# Patient Record
Sex: Female | Born: 1985 | Race: Black or African American | Hispanic: No | Marital: Single
Health system: Southern US, Community
[De-identification: ages and names within clinical notes are randomized; demographics above are authoritative.]

---
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
## 2005-02-03 ENCOUNTER — Emergency Department (HOSPITAL_COMMUNITY): Admission: EM | Admit: 2005-02-03 | Discharge: 2005-02-03 | Payer: Self-pay | Admitting: Emergency Medicine

## 2005-03-18 ENCOUNTER — Emergency Department (HOSPITAL_COMMUNITY): Admission: EM | Admit: 2005-03-18 | Discharge: 2005-03-18 | Payer: Self-pay | Admitting: Emergency Medicine

## 2005-06-06 ENCOUNTER — Inpatient Hospital Stay (HOSPITAL_COMMUNITY): Admission: AD | Admit: 2005-06-06 | Discharge: 2005-06-06 | Payer: Self-pay | Admitting: Obstetrics

## 2005-06-25 ENCOUNTER — Inpatient Hospital Stay (HOSPITAL_COMMUNITY): Admission: AD | Admit: 2005-06-25 | Discharge: 2005-06-26 | Payer: Self-pay | Admitting: Obstetrics

## 2005-07-11 ENCOUNTER — Inpatient Hospital Stay (HOSPITAL_COMMUNITY): Admission: AD | Admit: 2005-07-11 | Discharge: 2005-07-11 | Payer: Self-pay | Admitting: Obstetrics

## 2005-07-11 ENCOUNTER — Ambulatory Visit: Payer: Self-pay | Admitting: Family Medicine

## 2005-07-23 ENCOUNTER — Inpatient Hospital Stay (HOSPITAL_COMMUNITY): Admission: AD | Admit: 2005-07-23 | Discharge: 2005-07-23 | Payer: Self-pay | Admitting: Obstetrics

## 2005-08-01 ENCOUNTER — Inpatient Hospital Stay (HOSPITAL_COMMUNITY): Admission: AD | Admit: 2005-08-01 | Discharge: 2005-08-01 | Payer: Self-pay | Admitting: Obstetrics & Gynecology

## 2005-08-02 ENCOUNTER — Inpatient Hospital Stay (HOSPITAL_COMMUNITY): Admission: AD | Admit: 2005-08-02 | Discharge: 2005-08-04 | Payer: Self-pay | Admitting: Obstetrics & Gynecology

## 2006-07-30 IMAGING — CT CT ANGIO CHEST
1 of 3 series · 20 of 32 positions shown · IV contrast (APPLIED)
Comparison: none

CLINICAL DATA: Shortness of breath. Chest/back pain.  Possible pulmonary embolus.  Pregnant patient.  
 The patient was shielded for the study.  

 CT ANGIOGRAPHY OF CHEST:
TECHNIQUE: Multidetector CT imaging of the chest was performed during bolus injection of intravenous contrast.  Multiplanar CT angiographic image reconstructions were generated to evaluate the vascular anatomy.
 Contrast:  80 cc Omnipaque 300. 
 There was no evidence for pulmonary emboli.  There were no infiltrates or pulmonary nodules.  The mediastinal soft tissues have a normal appearance as do the chest wall structures.

[Series 5: pe 1.0 b20f st · axial · 0.59mm/px · z∈[+1592,+1797]mm · 20 of 229 slices shown]
[im 12/229  lung]
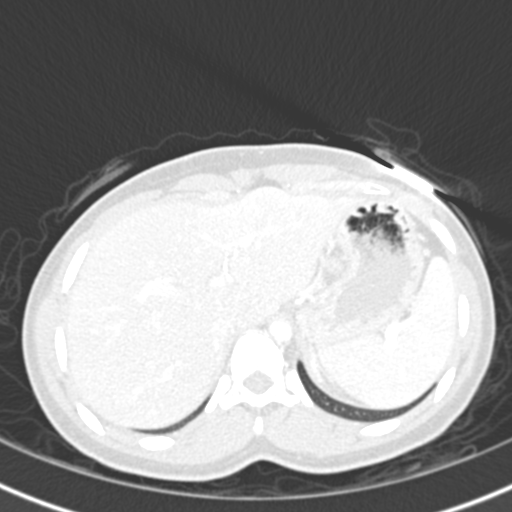
[im 23/229  mediastinal]
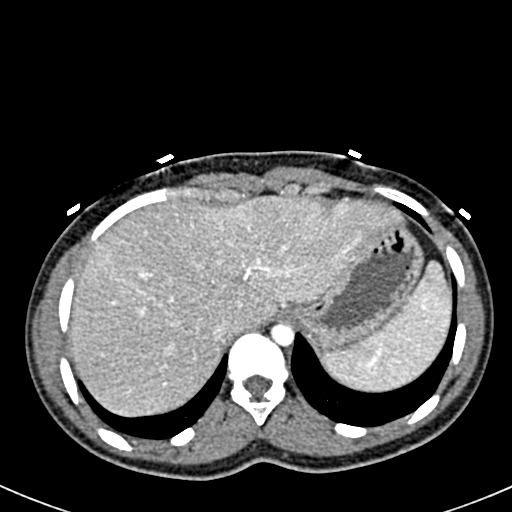
[im 35/229  lung]
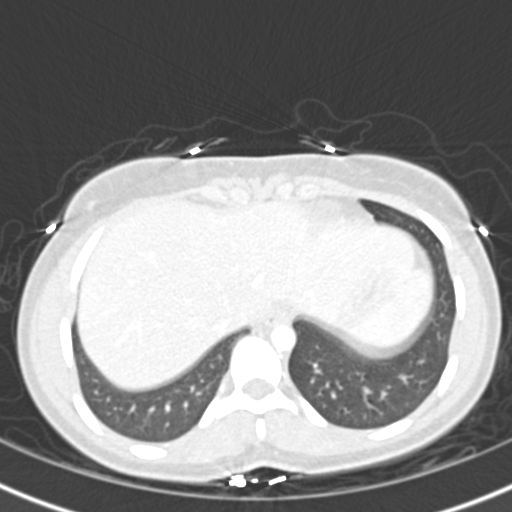
[im 46/229  mediastinal]
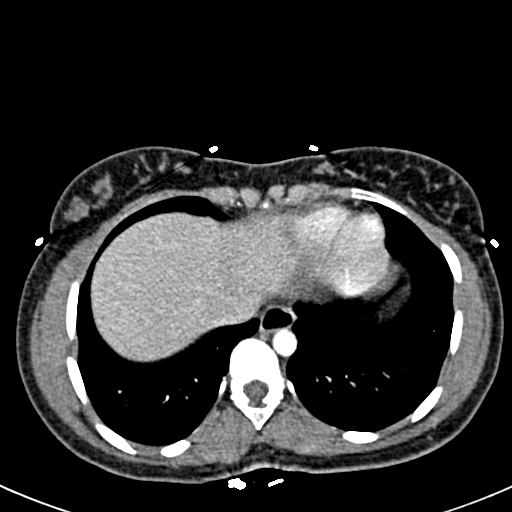
[im 58/229  lung]
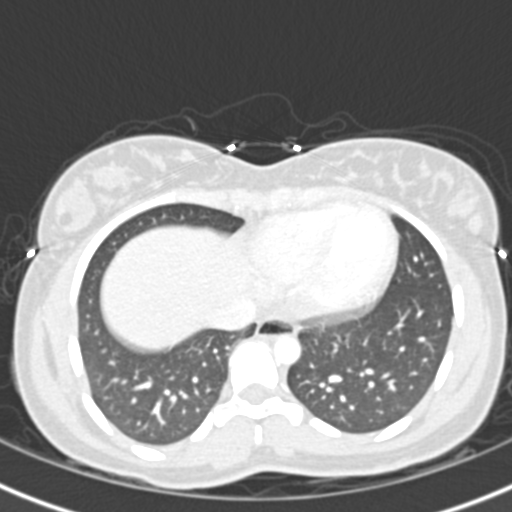
[im 77/229  mediastinal]
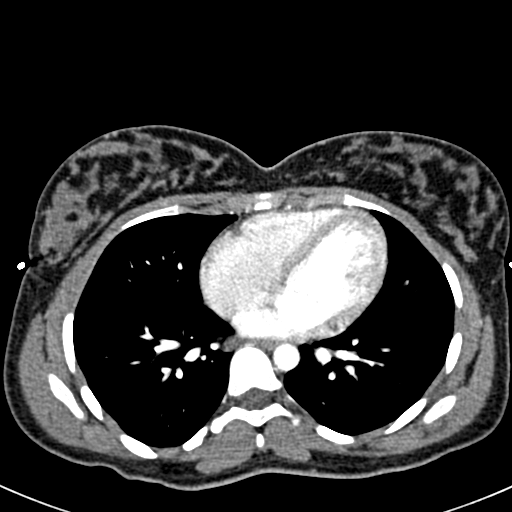
[im 80/229  lung]
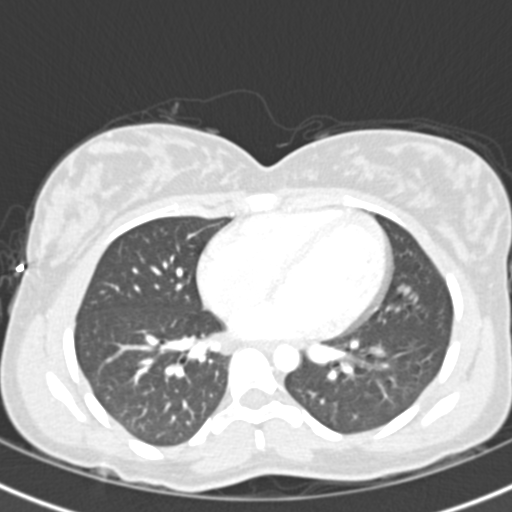
[im 92/229  mediastinal]
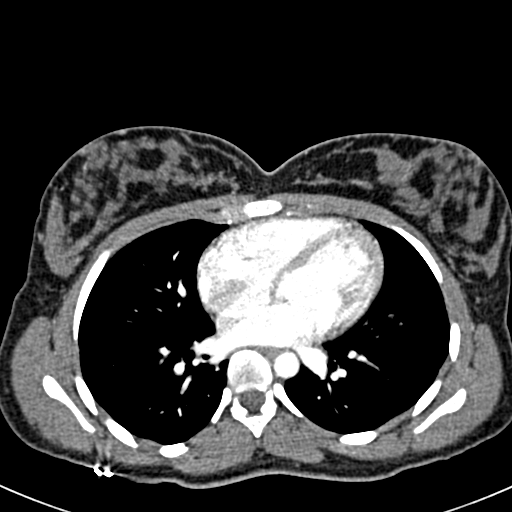
[im 103/229  lung]
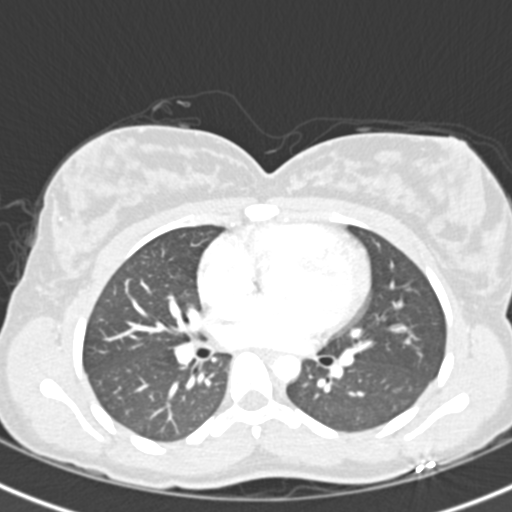
[im 105/229  mediastinal]
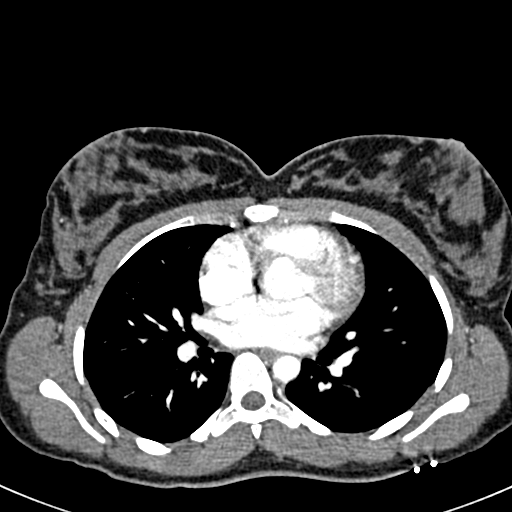
[im 115/229  lung]
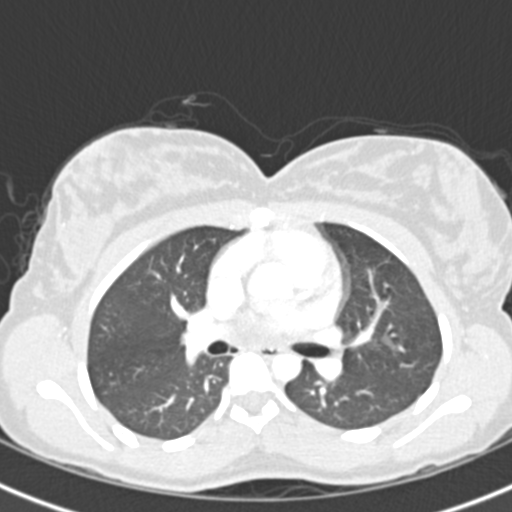
[im 126/229  mediastinal]
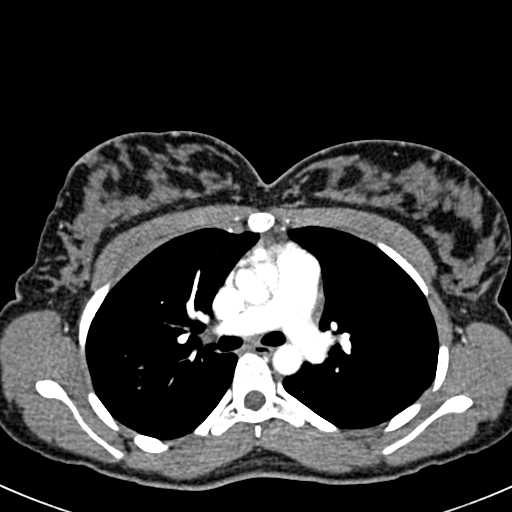
[im 137/229  lung]
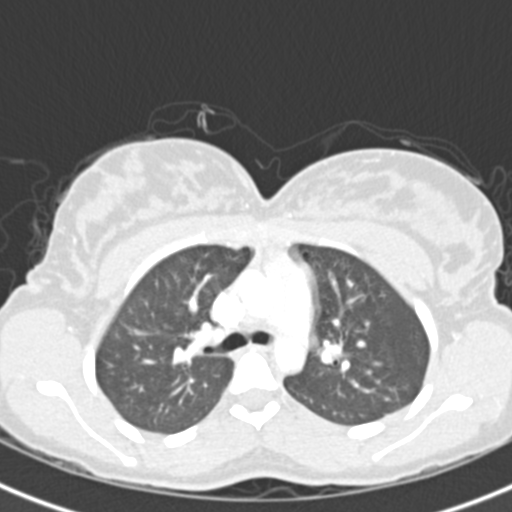
[im 149/229  mediastinal]
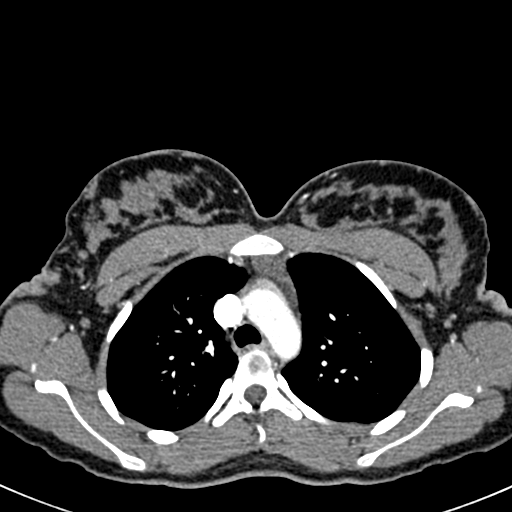
[im 153/229  lung]
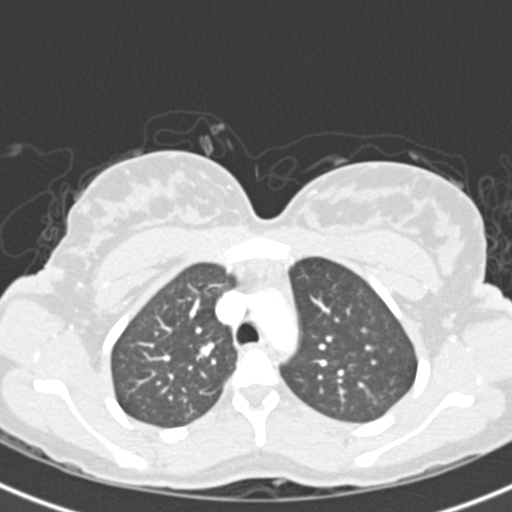
[im 172/229  mediastinal]
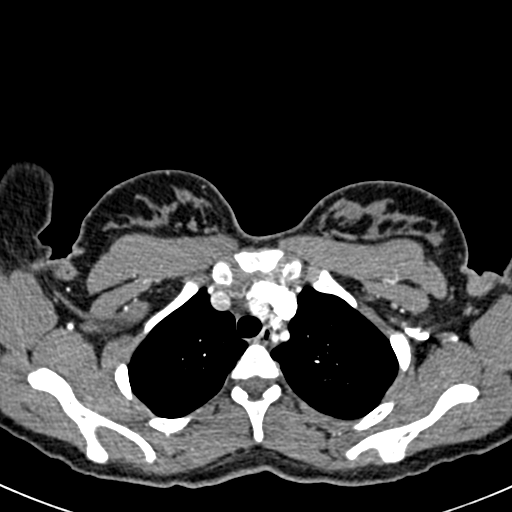
[im 183/229  lung]
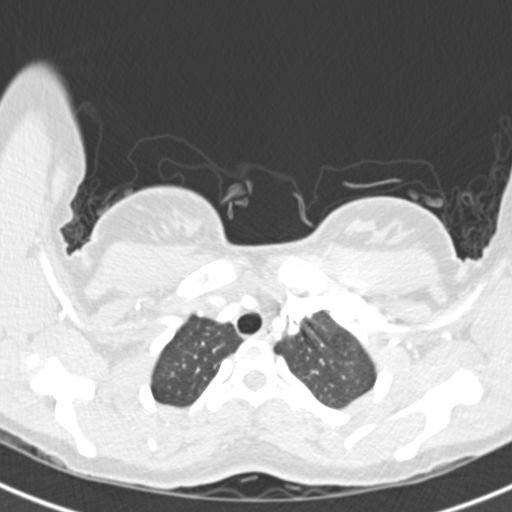
[im 194/229  mediastinal]
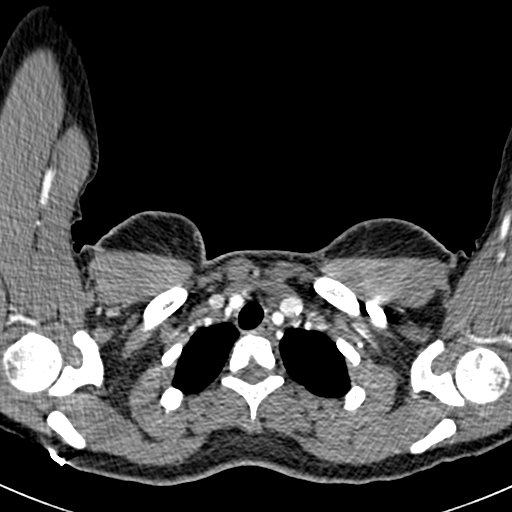
[im 206/229  lung]
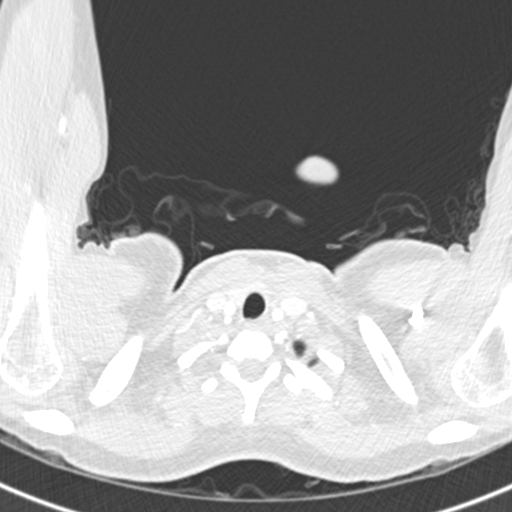
[im 217/229  mediastinal]
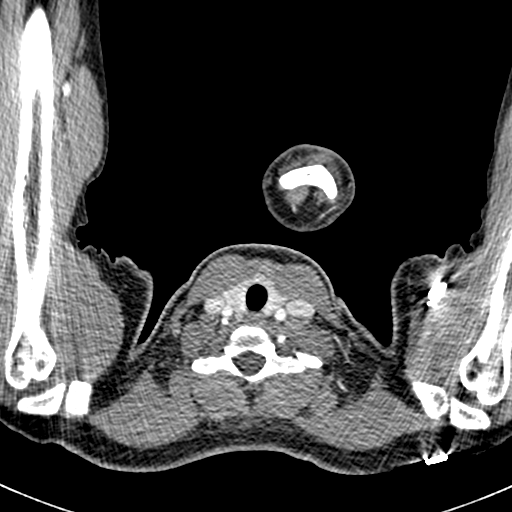

[20 of 32 positions shown; findings below may reference images not displayed]

IMPRESSION: Normal study.

## 2011-01-27 ENCOUNTER — Emergency Department (HOSPITAL_COMMUNITY)
Admission: EM | Admit: 2011-01-27 | Discharge: 2011-01-28 | Disposition: A | Discharge status: Home / Self Care | Payer: Medicaid Other | Attending: Emergency Medicine | Admitting: Emergency Medicine

## 2011-01-27 ENCOUNTER — Emergency Department (HOSPITAL_COMMUNITY): Payer: Medicaid Other

## 2011-01-27 DIAGNOSIS — T426X4A Poisoning by other antiepileptic and sedative-hypnotic drugs, undetermined, initial encounter: Secondary | ICD-10-CM | POA: Insufficient documentation

## 2011-01-27 DIAGNOSIS — T426X1A Poisoning by other antiepileptic and sedative-hypnotic drugs, accidental (unintentional), initial encounter: Secondary | ICD-10-CM | POA: Insufficient documentation

## 2011-01-27 DIAGNOSIS — T391X1A Poisoning by 4-Aminophenol derivatives, accidental (unintentional), initial encounter: Secondary | ICD-10-CM | POA: Insufficient documentation

## 2011-01-27 DIAGNOSIS — R51 Headache: Secondary | ICD-10-CM | POA: Insufficient documentation

## 2011-01-27 DIAGNOSIS — E876 Hypokalemia: Secondary | ICD-10-CM | POA: Insufficient documentation

## 2011-01-27 LAB — URINALYSIS, ROUTINE W REFLEX MICROSCOPIC
Bilirubin Urine: NEGATIVE
Ketones, ur: NEGATIVE mg/dL
Nitrite: NEGATIVE
Specific Gravity, Urine: 1.007 (ref 1.005–1.030)
Urobilinogen, UA: 0.2 mg/dL (ref 0.0–1.0)

## 2011-01-27 LAB — DIFFERENTIAL
Basophils Absolute: 0 10*3/uL (ref 0.0–0.1)
Basophils Relative: 0 % (ref 0–1)
Eosinophils Relative: 1 % (ref 0–5)
Lymphocytes Relative: 34 % (ref 12–46)
Neutro Abs: 5.3 10*3/uL (ref 1.7–7.7)
Neutrophils Relative %: 55 % (ref 43–77)

## 2011-01-27 LAB — CBC
HCT: 38.9 % (ref 36.0–46.0)
MCH: 31.1 pg (ref 26.0–34.0)
Platelets: 259 10*3/uL (ref 150–400)
RDW: 12.1 % (ref 11.5–15.5)

## 2011-01-27 LAB — BASIC METABOLIC PANEL
Calcium: 9.4 mg/dL (ref 8.4–10.5)
GFR calc Af Amer: >60 mL/min (ref >60)
GFR calc non Af Amer: >60 mL/min (ref >60)
Potassium: 3.3 mEq/L — ABNORMAL LOW (ref 3.5–5.1)
Sodium: 135 mEq/L (ref 135–145)

## 2011-01-27 LAB — ACETAMINOPHEN LEVEL
Acetaminophen (Tylenol), Serum: 14.9 ug/mL (ref 10–30)
Acetaminophen (Tylenol), Serum: <10 ug/mL — ABNORMAL LOW (ref 10–30)

## 2011-01-27 LAB — RAPID URINE DRUG SCREEN, HOSP PERFORMED
Opiates: NOT DETECTED
Tetrahydrocannabinol: NOT DETECTED

## 2011-01-27 LAB — PREGNANCY, URINE: Preg Test, Ur: NEGATIVE

## 2011-01-27 LAB — SALICYLATE LEVEL: Salicylate Lvl: <4 mg/dL (ref 2.8–20.0)

## 2021-05-09 ENCOUNTER — Encounter (HOSPITAL_COMMUNITY): Payer: Self-pay | Admitting: *Deleted

## 2021-05-09 ENCOUNTER — Other Ambulatory Visit: Payer: Self-pay

## 2021-05-09 ENCOUNTER — Emergency Department (HOSPITAL_COMMUNITY)
Admission: EM | Admit: 2021-05-09 | Discharge: 2021-05-10 | Disposition: A | Discharge status: Home / Self Care | Payer: 59 | Attending: Emergency Medicine | Admitting: Emergency Medicine

## 2021-05-09 ENCOUNTER — Emergency Department (HOSPITAL_COMMUNITY): Payer: 59

## 2021-05-09 DIAGNOSIS — W1839XA Other fall on same level, initial encounter: Secondary | ICD-10-CM | POA: Insufficient documentation

## 2021-05-09 DIAGNOSIS — S99911A Unspecified injury of right ankle, initial encounter: Secondary | ICD-10-CM | POA: Diagnosis present

## 2021-05-09 DIAGNOSIS — S82851A Displaced trimalleolar fracture of right lower leg, initial encounter for closed fracture: Secondary | ICD-10-CM | POA: Insufficient documentation

## 2021-05-09 NOTE — ED Triage Notes (Signed)
She fell 2 days ago while she was in [place]  she came back andwants treatment   she was told that she had bones broken in her rt lower leg  lmp Wednesday.  Papers from the hospital report a trimalliolar fractured didlocation rt ankle

## 2021-05-10 MED ORDER — OXYCODONE-ACETAMINOPHEN 5-325 MG PO TABS: 1.0000 | ORAL_TABLET | Freq: Four times a day (QID) | ORAL | 0 refills | Status: AC | PRN

## 2021-05-10 MED ORDER — MORPHINE SULFATE (PF) 4 MG/ML IV SOLN
4.0000 mg | Freq: Once | INTRAVENOUS | Status: AC
  Administered 2021-05-10: 4 mg via INTRAVENOUS
  Filled 2021-05-10: qty 1

## 2021-05-10 MED ORDER — OXYCODONE-ACETAMINOPHEN 5-325 MG PO TABS
1.0000 | ORAL_TABLET | Freq: Once | ORAL | Status: AC
  Administered 2021-05-10: 1 via ORAL
  Filled 2021-05-10: qty 1

## 2021-05-10 MED ORDER — ACETAMINOPHEN 325 MG PO TABS
650.0000 mg | ORAL_TABLET | Freq: Once | ORAL | Status: DC
  Filled 2021-05-10: qty 2

## 2021-05-10 NOTE — Consult Note (Signed)
Reason for Consult:Right ankle fx Referring Physician: Vanetta Mulders Time called: 0803 Time at bedside: 0847   Angie Charles is an 35 y.o. female.  HPI: Angie Charles was in [place] on vacation when she slipped on a pool deck and broke her ankle. She had immediate pain and was unable to bear weight. She went to a hospital there and they recommended surgery but wouldn't accept her insurance so she left AMA and returned home. Her family brought her to the hospital here and orthopedic surgery was consulted. She lives in [place] however.  History reviewed. No pertinent past medical history.  History reviewed. No pertinent surgical history.  No family history on file.  Social History:  reports that she does not have a smoking history on file. She has never been exposed to tobacco smoke. She does not have any smokeless tobacco history on file. She reports current alcohol use. No history on file for drug use.  Allergies: No Known Allergies  Medications: I have reviewed the patient's current medications.  No results found for this or any previous visit (from the past 48 hour(s)).  DG Ankle Complete Right  Result Date: 05/09/2021 CLINICAL DATA:  Initial evaluation for acute trauma, fall. EXAM: RIGHT ANKLE - COMPLETE 3+ VIEW COMPARISON:  None. FINDINGS: Acute comminuted oblique fracture of the distal right fibula with intra-articular extension, with slight lateral and posterior displacement. Additional acute transverse minimally displaced fracture extends through the medial malleolus. Tiny osseous density adjacent to the posterior tibia on lateral view could reflect a small associated posterior malleolar fracture. Asymmetric widening of the medial ankle mortise. Talar dome intact. Diffuse soft tissue swelling about the ankle. IMPRESSION: 1. Acute fractures of the distal right fibula and medial malleolus as above. 2. Additional tiny osseous density adjacent to the posterior tibia, which could reflect a  small associated posterior malleolar fracture fragment. Electronically Signed   By: Rise Mu M.D.   On: 05/09/2021 21:14    Review of Systems  HENT:  Negative for ear discharge, ear pain, hearing loss and tinnitus.   Eyes:  Negative for photophobia and pain.  Respiratory:  Negative for cough and shortness of breath.   Cardiovascular:  Negative for chest pain.  Gastrointestinal:  Negative for abdominal pain, nausea and vomiting.  Genitourinary:  Negative for dysuria, flank pain, frequency and urgency.  Musculoskeletal:  Positive for arthralgias (Right ankle). Negative for back pain, myalgias and neck pain.  Neurological:  Negative for dizziness and headaches.  Hematological:  Does not bruise/bleed easily.  Psychiatric/Behavioral:  The patient is not nervous/anxious.   Blood pressure (!) 133/111, pulse 79, temperature 98.3 F (36.8 C), temperature source Oral, resp. rate 18, height 5\' 3"  (1.6 m), weight 77.1 kg, last menstrual period 05/05/2021, SpO2 100 %. Physical Exam Constitutional:      General: She is not in acute distress.    Appearance: She is well-developed. She is not diaphoretic.  HENT:     Head: Normocephalic and atraumatic.  Eyes:     General: No scleral icterus.       Right eye: No discharge.        Left eye: No discharge.     Conjunctiva/sclera: Conjunctivae normal.  Cardiovascular:     Rate and Rhythm: Normal rate and regular rhythm.  Pulmonary:     Effort: Pulmonary effort is normal. No respiratory distress.  Musculoskeletal:     Cervical back: Normal range of motion.     Comments: LLE No traumatic wounds or rash, medial ankle  ecchymosis  Mod TTP ankle  No knee effusion  Knee stable to varus/ valgus and anterior/posterior stress  Sens DPN, SPN, TN intact  Motor EHL grossly intact  DP 1+, PT 0, Mod ankle edema  Skin:    General: Skin is warm and dry.  Neurological:     Mental Status: She is alert.  Psychiatric:        Mood and Affect: Mood normal.         Behavior: Behavior normal.    Assessment/Plan: Right ankle fx -- Will resplint, try to improve reduction a bit during process. Encouraged pt to have surgery in [place] as this will make f/u much easier for her. NWB.    Freeman Caldron, PA-C Orthopedic Surgery [phone] 05/10/2021, 9:00 AM

## 2021-05-10 NOTE — ED Notes (Signed)
Reviewed discharge instructions with patient. Follow-up care and medications reviewed. Patient verbalized understanding. Patient A&Ox4, VSS upon discharge. 

## 2021-05-10 NOTE — ED Provider Notes (Signed)
[facility] EMERGENCY DEPARTMENT Provider Note   CSN: [id number] Arrival date & time: 05/09/21  1912     History Chief Complaint  Patient presents with   Angie Charles Angie Charles is a 35 y.o. female.  HPI Patient is a 35 year old female with a past medical history notable for retinal detachment s/p surgery.  Patient is presenting today after fall that occurred on Friday in [place].  She was at a resort and was on vacation when she fell on a slick piece of wet tile near a pool fell to the ground and fractured her ankle she was seen at a emergency department there and was told that she need to be admitted for open reduction and internal fixation for trimalleolar ankle fracture.  She [facility] admission because [facility] did not [facility] insurance.  She was given a splint and traveled home via airplane.  She states that she has been taking Tylenol every 5-6 hours for pain.  She states she has no numbness in her foot no other significant injuries from the fall.  She states that she did hit her head but does not think that she lost consciousness.  She states that the details are a bit fuzzy but she has not had any headaches nausea vomiting numbness weakness anywhere in her body.  She denies any slurred speech or confusion.  She states that apart from pain which she states is severe achy constant located in her right ankle she has no other significant associated symptoms.  Pain is worse with touch and movement she has been hopping on her left foot and states that her left leg is sore but no focal pain.  No other associate symptoms.  No other aggravating or mitigating factors.  No other medications prior to arrival.  She received 1 Percocet in the waiting room.     History reviewed. No pertinent past medical history.  There are no problems to display for this patient.   History reviewed. No pertinent surgical history.   OB History   No obstetric  history on file.     No family history on file.  Social History   Tobacco Use   Passive exposure: Never  Substance Use Topics   Alcohol use: Yes    Home Medications Prior to Admission medications   Medication Sig Start Date End Date Taking? Authorizing Provider  acetaminophen (TYLENOL) 500 MG tablet Take 1,000 mg by mouth every 6 (six) hours as needed for moderate pain.   Yes [provider]  Acetaminophen-Caff-Pyrilamine (MIDOL MAX ST MENSTRUAL PO) Take 2 tablets by mouth daily as needed (cramping).   Yes [provider]  ELDERBERRY PO Take 1 tablet by mouth daily.   Yes [provider]  Fexofenadine HCl (ALLERGY 24-HR PO) Take 1 tablet by mouth daily as needed (allergies).   Yes [provider]  Multiple Vitamins-Minerals (ONE-A-DAY WOMENS PO) Take 1 tablet by mouth daily.   Yes [provider]  oxyCODONE-acetaminophen (PERCOCET/ROXICET) 5-325 MG tablet Take 1 tablet by mouth every 6 (six) hours as needed for severe pain. 05/10/21  Yes Gailen Shelter, PA    Allergies    Patient has no known allergies.  Review of Systems   Review of Systems  Constitutional:  Negative for fever.  HENT:  Negative for congestion.   Respiratory:  Negative for shortness of breath.   Cardiovascular:  Negative for chest pain.  Gastrointestinal:  Negative for abdominal distention.  Musculoskeletal:  Right ankle pain  Neurological:  Negative for dizziness and headaches.   Physical Exam Updated Vital Signs BP (!) 141/84 (BP Location: Right Arm)   Pulse 84   Temp 98.3 F (36.8 C) (Oral)   Resp 17   Ht 5\' 3"  (1.6 m)   Wt 77.1 kg   LMP 05/05/2021   SpO2 99%   BMI 30.11 kg/m   Physical Exam Vitals and nursing note reviewed.  Constitutional:      General: She is not in acute distress.    Appearance: Normal appearance. She is not ill-appearing.  HENT:     Head: Normocephalic and atraumatic.  Eyes:     General: No scleral icterus.        Right eye: No discharge.        Left eye: No discharge.     Conjunctiva/sclera: Conjunctivae normal.  Cardiovascular:     Rate and Rhythm: Normal rate.     Comments: Bilateral DP PT pulses are palpable Pulmonary:     Effort: Pulmonary effort is normal.     Breath sounds: No stridor.  Musculoskeletal:     Comments: Mildly deformed right ankle.  There is diffuse tenderness to palpation worse over the right lateral malleolus.  Able to wiggle toes.  Skin:    General: Skin is warm and dry.  Neurological:     Mental Status: She is alert and oriented to person, place, and time. Mental status is at baseline.     Comments: Sensation intact in all aspects of right foot    ED Results / Procedures / Treatments   Labs (all labs ordered are listed, but only abnormal results are displayed) Labs Reviewed - No data to display  EKG None  Radiology DG Ankle Complete Right  Result Date: 05/09/2021 CLINICAL DATA:  Initial evaluation for acute trauma, fall. EXAM: RIGHT ANKLE - COMPLETE 3+ VIEW COMPARISON:  None. FINDINGS: Acute comminuted oblique fracture of the distal right fibula with intra-articular extension, with slight lateral and posterior displacement. Additional acute transverse minimally displaced fracture extends through the medial malleolus. Tiny osseous density adjacent to the posterior tibia on lateral view could reflect a small associated posterior malleolar fracture. Asymmetric widening of the medial ankle mortise. Talar dome intact. Diffuse soft tissue swelling about the ankle. IMPRESSION: 1. Acute fractures of the distal right fibula and medial malleolus as above. 2. Additional tiny osseous density adjacent to the posterior tibia, which could reflect a small associated posterior malleolar fracture fragment. Electronically Signed   By: 07/09/2021 M.D.   On: 05/09/2021 21:14    Procedures Procedures   Medications Ordered in ED Medications  oxyCODONE-acetaminophen  (PERCOCET/ROXICET) 5-325 MG per tablet 1 tablet (1 tablet Oral Given 05/10/21 0439)  morphine 4 MG/ML injection 4 mg (4 mg Intravenous Given 05/10/21 05/12/21)    ED Course  I have reviewed the triage vital signs and the nursing notes.  Pertinent labs & imaging results that were available during my care of the patient were reviewed by me and considered in my medical decision making (see chart for details).  Clinical Course as of 05/10/21 0942  Mon May 10, 2021  May 12, 2021 Discussed with [id number] of orthopedic surgery.  Patient will follow up with orthopedics for consideration of surgery and [place] [facility] where she lives.  In the meantime we will provide patient with some morphine and splint patient with posterior splint.  [WF]    Clinical Course User Index [WF] Dale [place], PA   MDM Rules/Calculators/A&P  Patient is a 35 year old female presenting today with right ankle pain she states that she was diagnosed with trimalleolar right ankle fracture while in [place] the injury occurred on Friday today is Monday.  Is been just under 72 hours.  She went to the ER in [place] had a reduction done and was discharged/left AMA she did not receive any pain medication for her child at home and has been taking Tylenol only.  She has been in the ER for over 12 hours before she was assessed by myself because of prolonged wait times.  She has received 1 tablet of Percocet during her wait time.  I divided patient with 4 mg of morphine for analgesia and reviewed ankle x-ray which shows trimalleolar ankle fracture.  Discussed with orthopedics.  Follow recommendations and splinted patient with Ortho-Glass with posterior leg splint with stirrups.  Medial pressure was applied to lateral malleolus.  Patient tolerated procedure well.  Patient discharged with Percocet, Tylenol ibuprofen recommendations and follow-up with orthopedics.  Final Clinical Impression(s) / ED Diagnoses Final  diagnoses:  Closed trimalleolar fracture of right ankle, initial encounter    Rx / DC Orders ED Discharge Orders          Ordered    oxyCODONE-acetaminophen (PERCOCET/ROXICET) 5-325 MG tablet  Every 6 hours PRN        05/10/21 0930             Gailen Shelter, [place] 05/10/21 4496    Vanetta Mulders, MD 05/12/21 1839

## 2021-05-10 NOTE — Discharge Instructions (Addendum)
You have an ankle fracture of the right ankle.   For pain please use tylenol and ibuprofen as discussed below. If you need to use percocet for pain please use 500 (instead of 1000 mg of tylenol).   Please use Tylenol or ibuprofen for pain.  You may use 600 mg ibuprofen every 6 hours or 1000 mg of Tylenol every 6 hours.  You may choose to alternate between the 2.  This would be most effective.  Not to exceed 4 g of Tylenol within 24 hours.  Not to exceed 3200 mg ibuprofen 24 hours.   Please follow-up with an orthopedic surgeon.   Please use ice on your ankle even with the splint this will help with swelling as some of that cold will percolate through the splint.  You are for assisting with this.   My phone number is [phone].  Please feel free to reach out about how you are doing. Work notes included for use IF needed.

## 2021-05-10 NOTE — Progress Notes (Signed)
Orthopedic Tech Progress Note Patient Details:  MILLIANNA SZYMBORSKI [birth year] [id number]  Ortho Devices Type of Ortho Device: Short leg splint, Stirrup splint, Crutches Ortho Device/Splint Location: Right Lower Extremity Ortho Device/Splint Interventions: Ordered, Application, Adjustment   Post Interventions Patient Tolerated: Well Instructions Provided: Care of device, Poper ambulation with device, Adjustment of device  Travonte Byard P Shaqueta Casady 05/10/2021, 10:22 AM

## 2021-05-10 NOTE — ED Notes (Signed)
Pt placed in a recliner and her leg was elevated
